# Patient Record
Sex: Female | Born: 1990 | Race: White | Hispanic: No | Marital: Single | Smoking: Never smoker
Health system: Southern US, Community
[De-identification: ages and names within clinical notes are randomized; demographics above are authoritative.]

## PROBLEM LIST (undated history)

## (undated) DIAGNOSIS — H4710 Unspecified papilledema: Secondary | ICD-10-CM

## (undated) HISTORY — PX: LUMBAR PUNCTURE: SHX1985

---

## 2013-04-11 ENCOUNTER — Encounter: Payer: Self-pay | Admitting: Neurology

## 2013-04-12 ENCOUNTER — Ambulatory Visit: Payer: Self-pay | Admitting: Neurology

## 2013-06-14 ENCOUNTER — Other Ambulatory Visit (HOSPITAL_COMMUNITY): Payer: Self-pay | Admitting: *Deleted

## 2013-06-14 DIAGNOSIS — G43909 Migraine, unspecified, not intractable, without status migrainosus: Secondary | ICD-10-CM

## 2013-06-18 ENCOUNTER — Encounter (HOSPITAL_COMMUNITY): Payer: Self-pay | Admitting: Pharmacy Technician

## 2013-06-20 ENCOUNTER — Other Ambulatory Visit: Payer: Self-pay | Admitting: Radiology

## 2013-06-21 ENCOUNTER — Ambulatory Visit (HOSPITAL_COMMUNITY)
Admission: RE | Admit: 2013-06-21 | Discharge: 2013-06-21 | Disposition: A | Discharge status: Home / Self Care | Payer: BC Managed Care – PPO | Source: Ambulatory Visit | Attending: Emergency Medicine | Admitting: Emergency Medicine

## 2013-06-21 DIAGNOSIS — G43909 Migraine, unspecified, not intractable, without status migrainosus: Secondary | ICD-10-CM

## 2013-06-21 DIAGNOSIS — R51 Headache: Secondary | ICD-10-CM | POA: Insufficient documentation

## 2013-06-21 DIAGNOSIS — H4710 Unspecified papilledema: Secondary | ICD-10-CM | POA: Insufficient documentation

## 2013-06-21 LAB — CSF CELL COUNT WITH DIFFERENTIAL
RBC Count, CSF: 49 /mm3 — ABNORMAL HIGH
TUBE #: 1
WBC, CSF: 1 /mm3 (ref 0–5)

## 2013-06-21 LAB — GLUCOSE, CSF: Glucose, CSF: 60 mg/dL (ref 43–76)

## 2013-06-21 LAB — PROTEIN, CSF: TOTAL PROTEIN, CSF: 27 mg/dL (ref 15–45)

## 2013-06-21 MED ORDER — ACETAMINOPHEN 500 MG PO TABS
1000.0000 mg | ORAL_TABLET | Freq: Four times a day (QID) | ORAL | Status: DC | PRN
  Filled 2013-06-21: qty 2

## 2013-06-21 NOTE — Discharge Instructions (Signed)

## 2013-06-22 LAB — FLUORESCENT TREPONEMAL AB(FTA)-IGG-BLD: FLUORESCENT TREPONEMAL AB, IGG: NONREACTIVE

## 2013-06-23 LAB — VDRL, CSF: SYPHILIS VDRL QUANT CSF: NONREACTIVE

## 2013-06-24 ENCOUNTER — Emergency Department (HOSPITAL_COMMUNITY): Payer: BC Managed Care – PPO

## 2013-06-24 ENCOUNTER — Encounter (HOSPITAL_COMMUNITY): Payer: Self-pay | Admitting: Emergency Medicine

## 2013-06-24 ENCOUNTER — Emergency Department (HOSPITAL_COMMUNITY)
Admission: EM | Admit: 2013-06-24 | Discharge: 2013-06-24 | Disposition: A | Discharge status: Home / Self Care | Payer: BC Managed Care – PPO | Attending: Emergency Medicine | Admitting: Emergency Medicine

## 2013-06-24 DIAGNOSIS — G971 Other reaction to spinal and lumbar puncture: Secondary | ICD-10-CM | POA: Insufficient documentation

## 2013-06-24 DIAGNOSIS — Z79899 Other long term (current) drug therapy: Secondary | ICD-10-CM | POA: Insufficient documentation

## 2013-06-24 DIAGNOSIS — H4710 Unspecified papilledema: Secondary | ICD-10-CM | POA: Diagnosis not present

## 2013-06-24 DIAGNOSIS — H53149 Visual discomfort, unspecified: Secondary | ICD-10-CM | POA: Diagnosis not present

## 2013-06-24 DIAGNOSIS — R51 Headache: Secondary | ICD-10-CM | POA: Insufficient documentation

## 2013-06-24 HISTORY — DX: Unspecified papilledema: H47.10

## 2013-06-24 LAB — URINALYSIS, ROUTINE W REFLEX MICROSCOPIC
BILIRUBIN URINE: NEGATIVE
Glucose, UA: NEGATIVE mg/dL
Hgb urine dipstick: NEGATIVE
KETONES UR: NEGATIVE mg/dL
Leukocytes, UA: NEGATIVE
NITRITE: NEGATIVE
PROTEIN: NEGATIVE mg/dL
Specific Gravity, Urine: 1.02 (ref 1.005–1.030)
Urobilinogen, UA: 0.2 mg/dL (ref 0.0–1.0)
pH: 5.5 (ref 5.0–8.0)

## 2013-06-24 LAB — POC URINE PREG, ED: Preg Test, Ur: NEGATIVE

## 2013-06-24 MED ORDER — DEXAMETHASONE SODIUM PHOSPHATE 10 MG/ML IJ SOLN
10.0000 mg | Freq: Once | INTRAMUSCULAR | Status: AC
  Administered 2013-06-24: 10 mg via INTRAVENOUS
  Filled 2013-06-24: qty 1

## 2013-06-24 MED ORDER — CAFFEINE-SODIUM BENZOATE 125-125 MG/ML IJ SOLN: 250.0000 mg | Freq: Once | INTRAMUSCULAR | Status: DC

## 2013-06-24 MED ORDER — SODIUM CHLORIDE 0.9 % IV BOLUS (SEPSIS)
1000.0000 mL | Freq: Once | INTRAVENOUS | Status: AC
  Administered 2013-06-24: 1000 mL via INTRAVENOUS

## 2013-06-24 MED ORDER — METOCLOPRAMIDE HCL 5 MG/ML IJ SOLN
10.0000 mg | Freq: Once | INTRAMUSCULAR | Status: AC
  Administered 2013-06-24: 10 mg via INTRAVENOUS
  Filled 2013-06-24: qty 2

## 2013-06-24 MED ORDER — IOHEXOL 180 MG/ML  SOLN
20.0000 mL | Freq: Once | INTRAMUSCULAR | Status: AC | PRN
  Administered 2013-06-24: 20 mL via INTRATHECAL

## 2013-06-24 MED ORDER — DIPHENHYDRAMINE HCL 50 MG/ML IJ SOLN
25.0000 mg | Freq: Once | INTRAMUSCULAR | Status: AC
  Administered 2013-06-24: 25 mg via INTRAVENOUS
  Filled 2013-06-24: qty 1

## 2013-06-24 NOTE — Discharge Instructions (Signed)
As discussed with our radiologist, it is important that you rest for the next 24 hours, try to maintain horizontal positioning.  Return here for concerning changes in her condition, otherwise please followup with your primary care physician and/or neurologist.   Epidural Blood Patch for Spinal Headache An epidural blood patch is a procedure that is used to treat a headache that occurs when there is a leak of spinal fluid. This type of headache is called a spinal headache or post-dural puncture headache. Spinal headaches sometimes occur after a person undergoes a type of anesthesia called epidural anesthesia or after a lumbar puncture (also called a spinal tap).  Generally, an epidural blood patch is done when a spinal headache has not been relieved by 2 3 days of:   Bed rest.   Drinking lots of fluids.   Taking oral medicines for pain, including nonsteroidal anti-inflammatory agents and caffeine. It may also be done to treat a person who has had epidural anesthesia and is experiencing:   Neck pain and stiffness that are very severe and associated with vomiting.   Hearing loss.   Double vision.  An epidural blood patch is not done when:   Your headache is due to an infection in the lower back (lumbar) area or the blood.   You have bleeding tendencies.   You are taking certain blood-thinning medicines. LET [facility] PROVIDER KNOW ABOUT:  Any allergies you have.   All medicines you are taking, including vitamins, herbs, eye drops, creams, and over-the-counter medicines.   Previous problems you or members of your family have had with the use of anesthetics.   Any blood disorders you have.   Previous surgeries you have had.   Medical conditions you have.  RISKS AND COMPLICATIONS Generally, an epidural blood patch is a safe procedure. However, as with any procedure, complications can occur. Possible complications include:   Backache.  Nerve pain, tingling,  or numbness.  Bleeding.  Infection. Complications are more likely to occur in people who have bleeding disorders or infections. BEFORE THE PROCEDURE  Drink a lot of water the day before your procedure.  Make sure your health care provider knows about all medicines and dietary or herbal supplements that you are taking. Take them as directed and find out if you need to stop any of them prior to the procedure.  Follow your health care provider's instructions for when to stop eating and drinking.  Arrange for someone to drive you to and from the procedure. PROCEDURE   You will have two IV lines placed one to give you fluids and medicines during the procedure and one to withdraw blood for the patch.  You will lie on your stomach.  An X-ray machine will take pictures of your back to locate the area of leakage.  Dye may be injected so that the area can be seen well on an X-ray.  Blood will be drawn from your arm and injected into the leaking area. When the blood is injected, you may feel tightness in your buttocks, lower back, or thighs. AFTER THE PROCEDURE  You will be expected to lie on your back for 2 4 hours with some pillows under your knees. It is important to lie still while on your back so that a good clot can form. You should also avoid any straining, especially right after the procedure.  Most people obtain almost instant relief from the spinal headache. In some, the relief comes on gradually over a 24-hour period.  Some people experience mild backaches for a few days. In a few cases, people also have a mild, passing sensation of prickly or tingly skin (paresthesia), neck pain, or nerve-root pain. Document Released: 10/02/2001 Document Revised: 01/31/2013 Document Reviewed: 11/22/2012 [facility] Patient Information 2014 [place], [place].

## 2013-06-24 NOTE — ED Provider Notes (Signed)
CSN: [id number]     Arrival date & time 06/24/13  0759 History   First MD Initiated Contact with Patient 06/24/13 [phone]     Chief Complaint  Patient presents with  . Headache    Post LP      HPI  Patient presents with headache.  Headache began approximately 3 days ago, soon after the patient had lumbar puncture. Since onset there is bed sore, anterior, throbbing headache.  Pain is not improved with OTC medication. There are no associated visual changes, neck pain, back pain, fever, vomiting. There is associated nausea. Patient had lumbar puncture performed because of papilledema, enlarged optic nerve according to the patient.  These findings were made during routine outpatient evaluation, not because of recent health changes. The patient states that the procedure itself was initially well tolerated, with no complications. She also denies any new unilateral weakness, incontinence, abdominal pain, chest pain, rash, swelling.   Past Medical History  Diagnosis Date  . Papilledema    Past Surgical History  Procedure Laterality Date  . Lumbar puncture     No family history on file. History  Substance Use Topics  . Smoking status: Never Smoker   . Smokeless tobacco: Not on file  . Alcohol Use: Yes     Comment: occasional    OB History   Grav Para Term Preterm Abortions TAB SAB Ect Mult Living                 Review of Systems  Constitutional:       Per HPI, otherwise negative  HENT:       Per HPI, otherwise negative  Eyes: Positive for photophobia.  Respiratory:       Per HPI, otherwise negative  Cardiovascular:       Per HPI, otherwise negative  Gastrointestinal: Negative for vomiting.  Endocrine:       Negative aside from HPI  Genitourinary:       Neg aside from HPI   Musculoskeletal:       Per HPI, otherwise negative  Skin: Negative.   Neurological: Positive for headaches. Negative for syncope.      Allergies  Shrimp  Home Medications   Current  Outpatient Rx  Name  Route  Sig  Dispense  Refill  . Aspirin-Acetaminophen-Caffeine (EXCEDRIN PO)   Oral   Take 2 tablets by mouth daily as needed (for headaches).         . Magnesium 100 MG TABS   Oral   Take 100 mg by mouth daily.         . Topiramate ER (TROKENDI XR) 100 MG CP24   Oral   Take 100 mg by mouth at bedtime.          BP 121/72  Pulse 91  Temp(Src) 98.1 F (36.7 C) (Oral)  Resp 16  Ht 5' 8.75" (1.746 m)  Wt 130 lb (58.968 kg)  BMI 19.34 kg/m2  SpO2 100%  LMP 06/15/2013 Physical Exam  Nursing note and vitals reviewed. Constitutional: She is oriented to person, place, and time. She appears well-developed and well-nourished.  Uncomfortable appearing young F  HENT:  Head: Normocephalic and atraumatic.  Eyes: Conjunctivae and EOM are normal. Pupils are equal, round, and reactive to light. Right eye exhibits no discharge. Left eye exhibits no discharge. No scleral icterus.  Appropriate consensual response No appreciable papilledema on direct visualization  Neck: Normal range of motion. Neck supple. No tracheal deviation present. No thyromegaly present.  Cardiovascular: Normal  rate and regular rhythm.   Pulmonary/Chest: Effort normal and breath sounds normal. No stridor. No respiratory distress.  Abdominal: She exhibits no distension.  Musculoskeletal: She exhibits no edema.       Arms: Lymphadenopathy:    She has no cervical adenopathy.  Neurological: She is alert and oriented to person, place, and time. She displays no atrophy and no tremor. No cranial nerve deficit or sensory deficit. She exhibits normal muscle tone. She displays no seizure activity. Coordination normal.  Skin: Skin is warm and dry.  Psychiatric: She has a normal mood and affect.    ED Course  Procedures (including critical care time) Labs Review Labs Reviewed  URINALYSIS, ROUTINE W REFLEX MICROSCOPIC  POC URINE PREG, ED   Imaging Review I reviewed the notes from IR LP 2/26 - no  complication.  +RBC in tube one, but otherwise unremarkable.    9:35 AM On re-exam - no new complaints.   11:15 AM With minimal improvement in her pain I discussed the patient's case with interventional radiology.  Patient will have lumbar blood patch.  2:39 PM Patient has returned from a interventional radiology.  She is now intolerant of oral intake, pain is improved substantially. MDM   Final diagnoses:  Post lumbar puncture headache    Patient presents for elective lumbar puncture, now with headache.  Patient did not improve here with initial rehydration, pain medication.  Interventional radiology was contacted, and graciously assisted with placement of a blood patch for post lumbar puncture headache.  Patient improved substantially, was appropriate for discharge.  With this improvement, absence of fever, neurologic dysfunction, there is low suspicion for occult infection or other systemic pathology    Gerhard Munchobert Ceyda Peterka, MD 06/24/13 1440

## 2013-06-24 NOTE — ED Notes (Signed)
Pt went to Radiology.

## 2013-06-24 NOTE — ED Notes (Signed)
Pt c/o headache to left side of head along with nausea that started Friday morning. Pt had LP done Thursday because of papilledema. Denies any changes in vision or neck and back pain. Pain increases when standing up.  Pt has tried acetaminophen, ASA and Excedrin with no relief.

## 2013-06-24 NOTE — ED Notes (Signed)
Back from Radiology.

## 2013-06-24 NOTE — Procedures (Signed)
Fluoroscopic guided epidural blood patch on the right at L4/5. Confirmed with 0.5 ml Omni 300 15ml of autologous blood. No complications. Pt. Given routine f/u orders of bedrest for 24 hrs.

## 2013-06-28 NOTE — Progress Notes (Signed)
Pt came in today to have us check her blood patch injection site. Site is slightly swollen, there is no discoloration or redness. Dr. Benard Rinkurnes in to visit and explained some of the blood was under her skin and that it would reabsorb in time. Pt told to use heat and ice to area and take ibuprofen as needed for discomfort.

## 2013-07-03 LAB — B. BURGDORFI ANTIBODIES, CSF: Lyme Ab: NEGATIVE

## 2015-03-21 IMAGING — RF DG FLUORO GUIDE LUMBAR PUNCTURE
1 series · 1 of 1 positions shown · non-contrast
Comparison: none

CLINICAL DATA: Headaches.  Papilledema.

[Series 1: run · 1 of 1 slices shown]
[im 1/1]
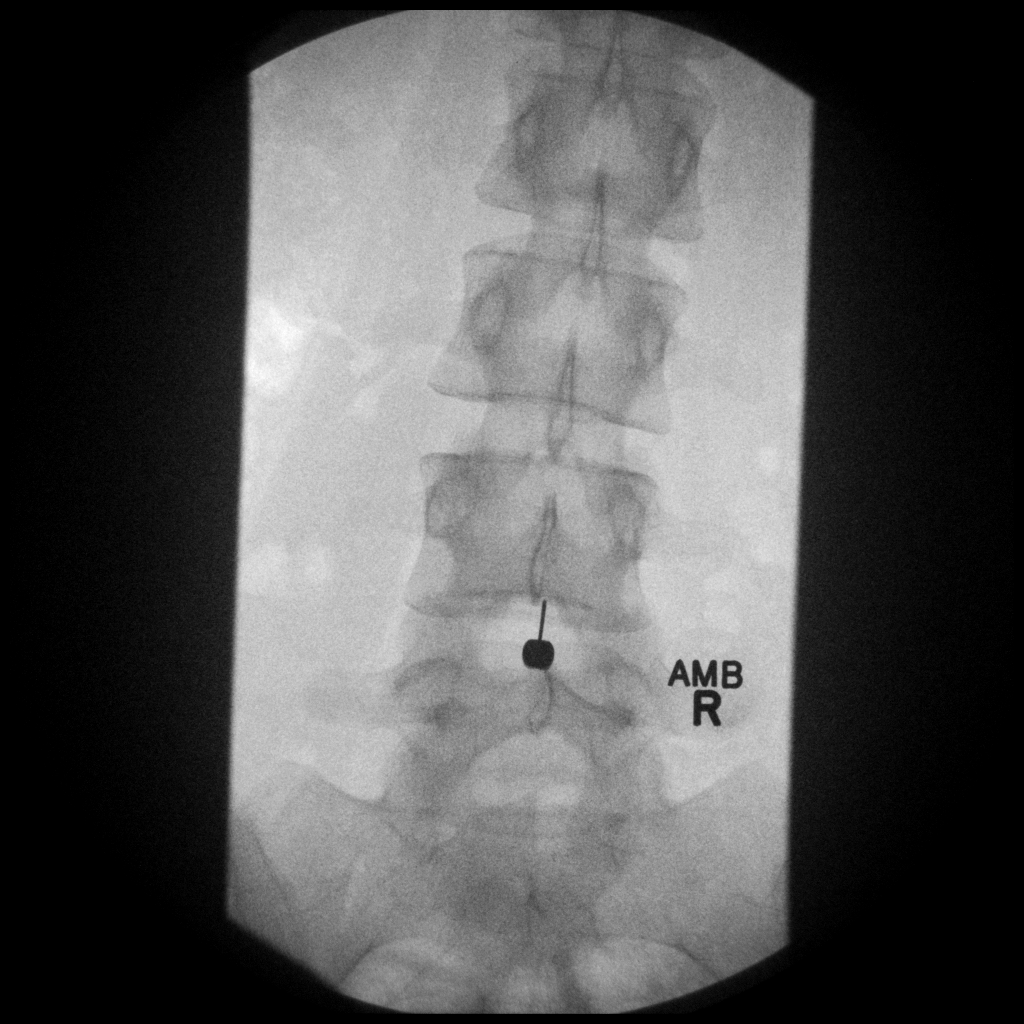

[1 of 1 positions shown; findings below may reference images not displayed]

EXAM:
DIAGNOSTIC LUMBAR PUNCTURE UNDER FLUOROSCOPIC GUIDANCE

FLUOROSCOPY TIME:  0 min 9 seconds

PROCEDURE:
Informed consent was obtained from the patient prior to the
procedure, including potential complications of headache, allergy,
and pain. With the patient prone, the lower back was prepped with
Betadine. 1% Lidocaine was used for local anesthesia. Lumbar
puncture was performed at the L4-5 level using a 21 gauge needle
with return of clear colorless CSF with an opening pressure of 24 cm
water. 23ml of CSF were obtained for laboratory studies. The patient
tolerated the procedure well and there were no apparent
complications.
IMPRESSION: Lumbar puncture performed without complication at L4-5.

## 2015-03-24 IMAGING — RF DG FLUORO GUIDE LUMBAR PUNCTURE
1 series · 1 of 1 positions shown · non-contrast
Comparison: none

CLINICAL DATA: Positional headaches following lumbar puncture. The
headaches have been resilient to other forms of treatment.

[Series 1: run · 1 of 1 slices shown]
[im 1/1]
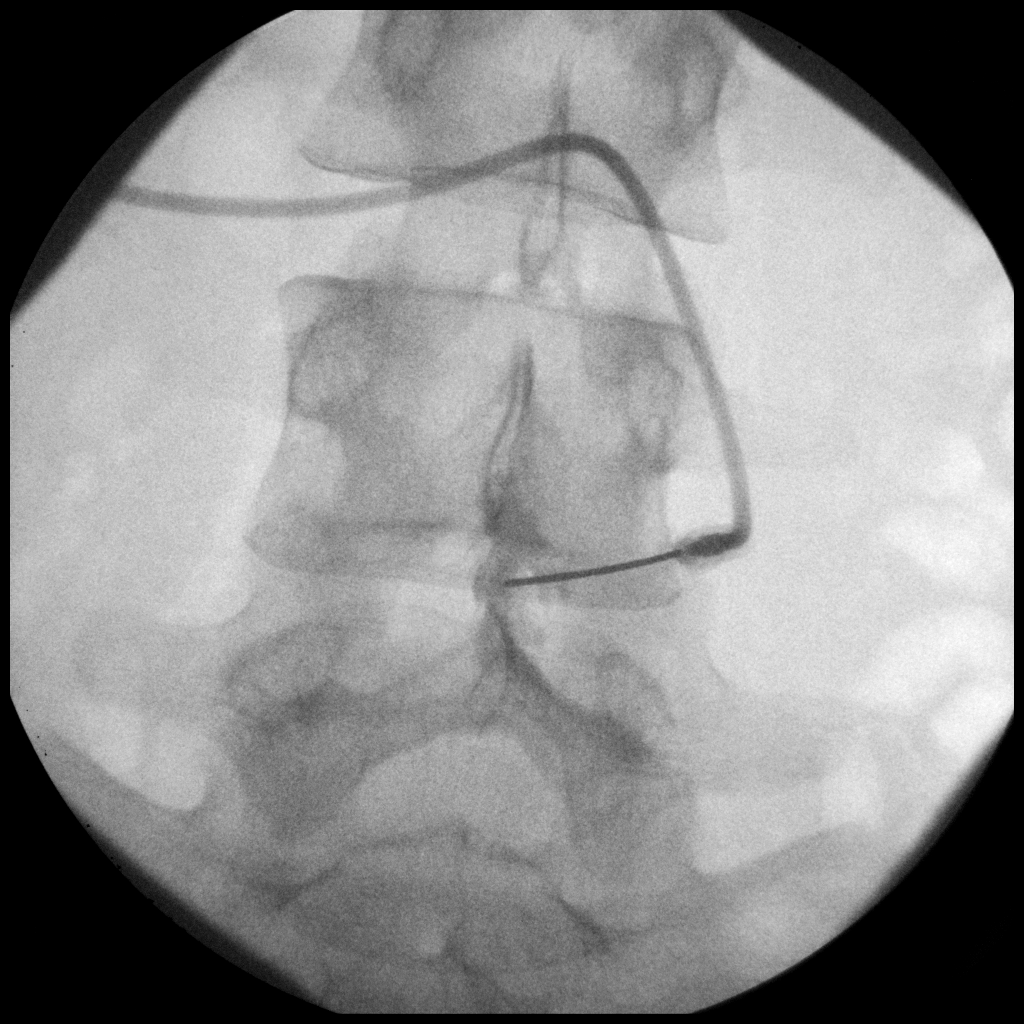

[1 of 1 positions shown; findings below may reference images not displayed]

EXAM:
LUMBAR PUNCTURE FLUORO GUIDE

PROCEDURE:
The procedure, risks, benefits, and alternatives were explained to
the patient. Questions regarding the procedure were encouraged and
answered. The patient understands and consents to the procedure.

LUMBAR EPIDURAL BLOOD PATCH: 15 ml of blood were withdrawn from the
patient's antecubital fossa. An epidural approach was taken on the
right at L4-5 using a 20 gauge Crawford epidural needle. Epidural
positioning was confirmed by injecting a small amount of Omnipaque
180. There was no vascular communication. 15 ml of the patient's
blood was slowly injected into the epidural space in this location.
The procedure was well-tolerated and she was discharged in good
condition with instructions to lie down for additional day.
IMPRESSION: Lumbar epidural blood patch on the right Ardon4-5.

## 2016-02-11 ENCOUNTER — Encounter: Payer: Self-pay | Admitting: Podiatry

## 2016-02-11 ENCOUNTER — Ambulatory Visit (INDEPENDENT_AMBULATORY_CARE_PROVIDER_SITE_OTHER): Payer: 59 | Admitting: Podiatry

## 2016-02-11 VITALS — BP 122/80 | HR 88 | Resp 16 | Ht 68.0 in | Wt 125.0 lb

## 2016-02-11 DIAGNOSIS — B351 Tinea unguium: Secondary | ICD-10-CM

## 2016-02-11 DIAGNOSIS — L600 Ingrowing nail: Secondary | ICD-10-CM | POA: Diagnosis not present

## 2016-02-11 MED ORDER — TERBINAFINE HCL 250 MG PO TABS: 250.0000 mg | ORAL_TABLET | Freq: Every day | ORAL | 0 refills | Status: AC

## 2016-02-11 NOTE — Patient Instructions (Signed)

## 2016-02-11 NOTE — Progress Notes (Signed)
   Subjective:    Patient ID: Courtney Mcmahon, female    DOB: 03/14/1991, 25 y.o.   MRN: [id number]  HPI Chief Complaint  Patient presents with  . Nail Problem    Bilateral; great toes; nail discoloration & thickened nails; pt stated, "stubbed toes several years ago and nails have not been the same since"      Review of Systems  All other systems reviewed and are negative.      Objective:   Physical Exam        Assessment & Plan:

## 2016-02-11 NOTE — Progress Notes (Signed)
Subjective:     Patient ID: Courtney Mcmahon, femaleMagda Kiel   DOB: 07/01/1990, 25 y.o.   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      Objective:   Physical Exam  Constitutional: She is oriented to person, place, and time.  Cardiovascular: Intact distal pulses.   Musculoskeletal: Normal range of motion.  Neurological: She is oriented to person, place, and time.  Skin: Skin is warm.  Nursing note and vitals reviewed.  neurovascular status intact muscle strength adequate range of motion within normal limits with patient found to have damaged hallux nails of both feet that are dystrophic and lifting. Patient has a family history of this with sister having 1 but does not remember specific trauma. 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 I reviewed the possibility for permanent nail removal versus temporary and also oral antifungal therapy. Patient wants to try this but I absolutely explaining there is no guarantee that this will grow out normally and that long-term it'll either be the same or possibly worse and that ultimately she may require nail removal. I infiltrated each hallux 60 mg I can Marcaine mixture remove the hallux nails created cleaning beds will allow a Azucena Kubaeid growth to occur and placed on Lamisil 250 mg daily for 90 days and also gave him a sheet for blood work to be done to include liver function. She is to reappoint in 4-5 months or earlier if any issues should occur

## 2016-06-16 ENCOUNTER — Other Ambulatory Visit: Payer: Self-pay | Admitting: Physician Assistant

## 2016-06-16 DIAGNOSIS — L989 Disorder of the skin and subcutaneous tissue, unspecified: Secondary | ICD-10-CM

## 2016-06-17 ENCOUNTER — Ambulatory Visit
Admission: RE | Admit: 2016-06-17 | Discharge: 2016-06-17 | Disposition: A | Discharge status: Home / Self Care | Payer: 59 | Source: Ambulatory Visit | Attending: Physician Assistant | Admitting: Physician Assistant

## 2016-06-17 DIAGNOSIS — L989 Disorder of the skin and subcutaneous tissue, unspecified: Secondary | ICD-10-CM

## 2016-07-14 ENCOUNTER — Ambulatory Visit: Payer: 59 | Admitting: Podiatry

## 2017-02-27 IMAGING — US US SOFT TISSUE HEAD/NECK
1 series · 14 of 16 positions shown · non-contrast
Comparison: None.

CLINICAL DATA: Bilateral posterior auricular lesions noted 2 days
ago

EXAM:
ULTRASOUND OF HEAD/NECK SOFT TISSUES
TECHNIQUE: Ultrasound examination of the head and neck soft tissues was
performed in the area of clinical concern.

[Series 1: us soft tissue head/neck · 0.05mm/px · 14 of 16 slices shown]
[im 1/16]
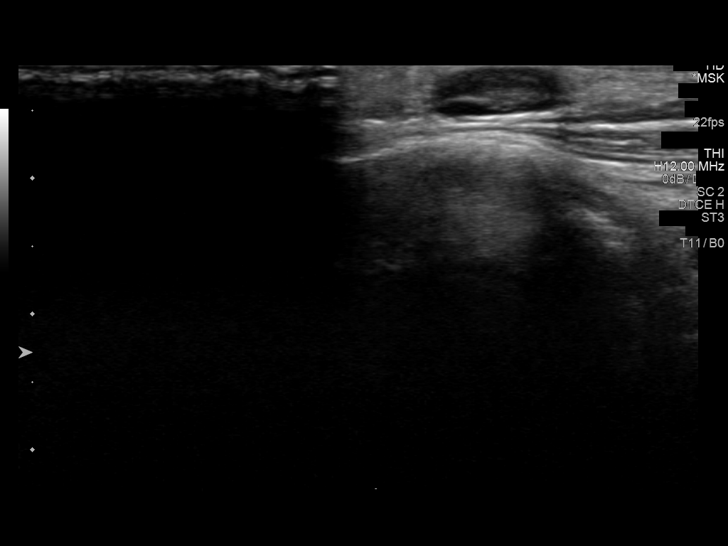
[im 2/16]
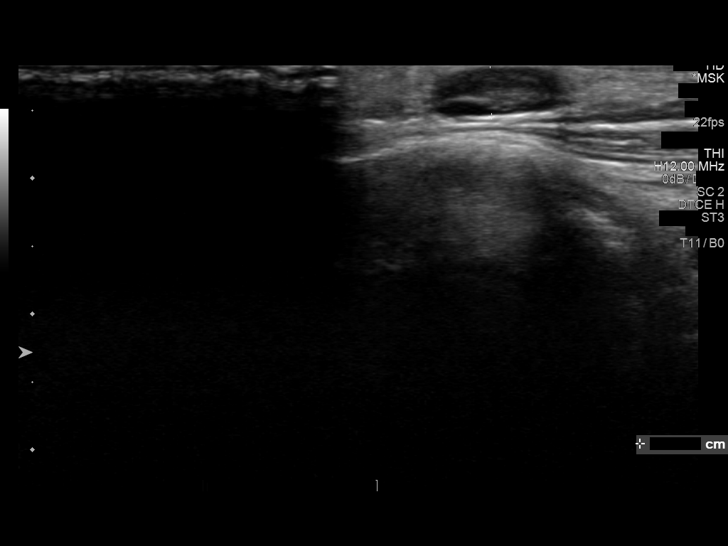
[im 3/16]
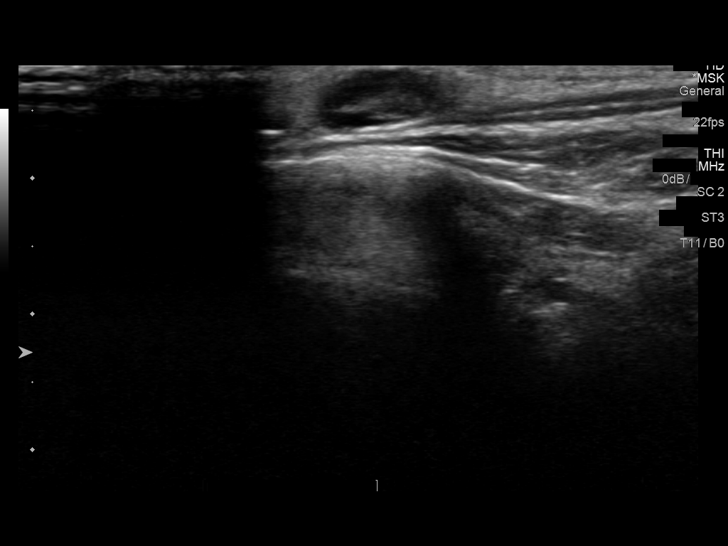
[im 5/16]
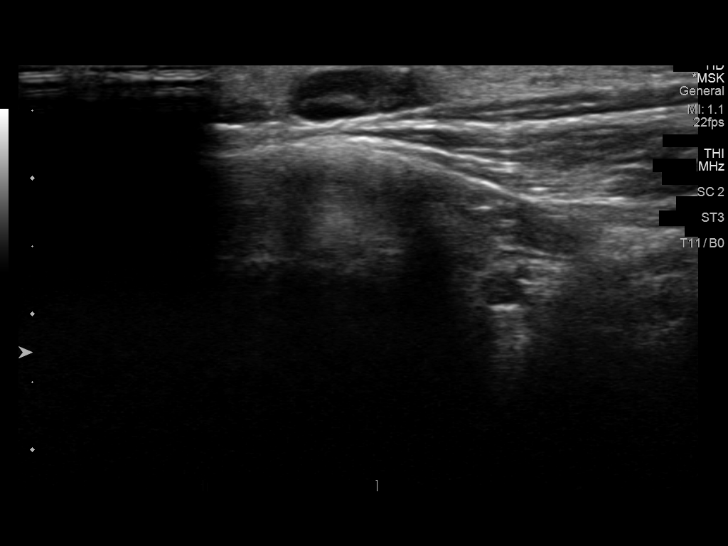
[im 6/16]
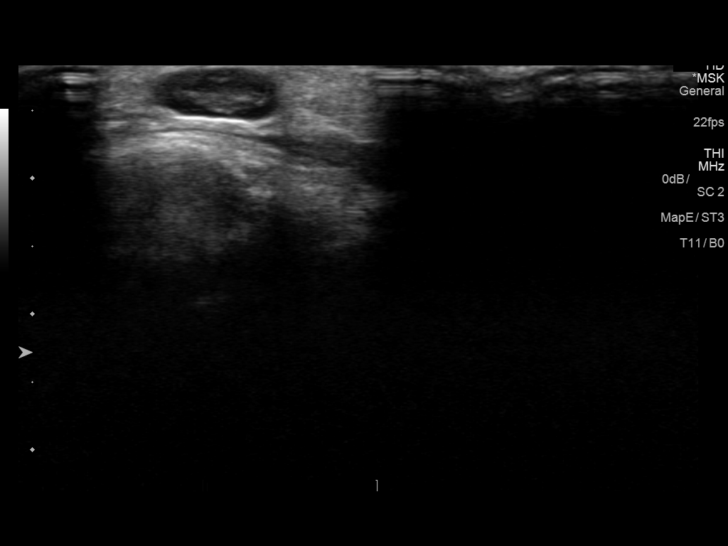
[im 7/16]
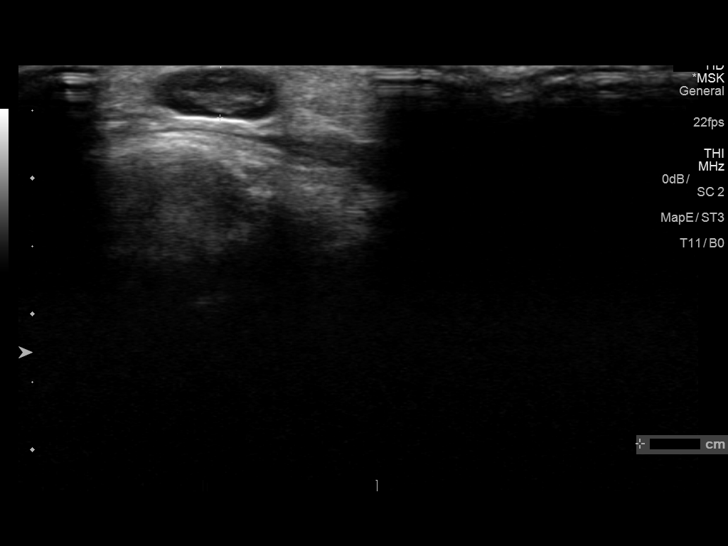
[im 8/16]
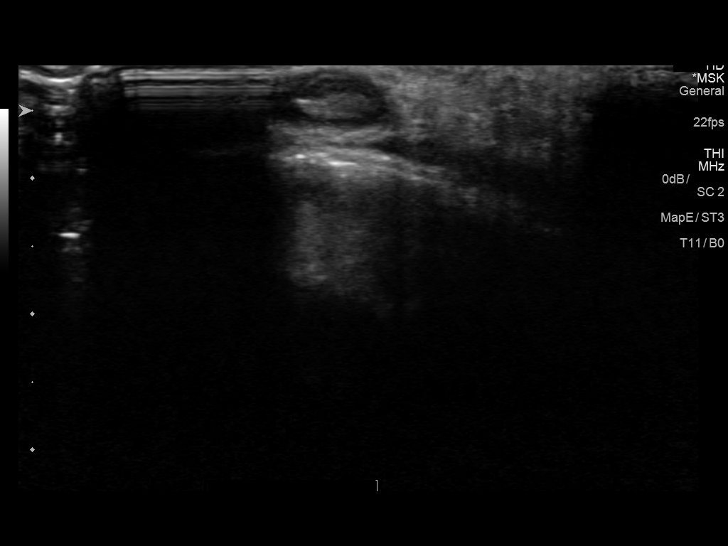
[im 9/16]
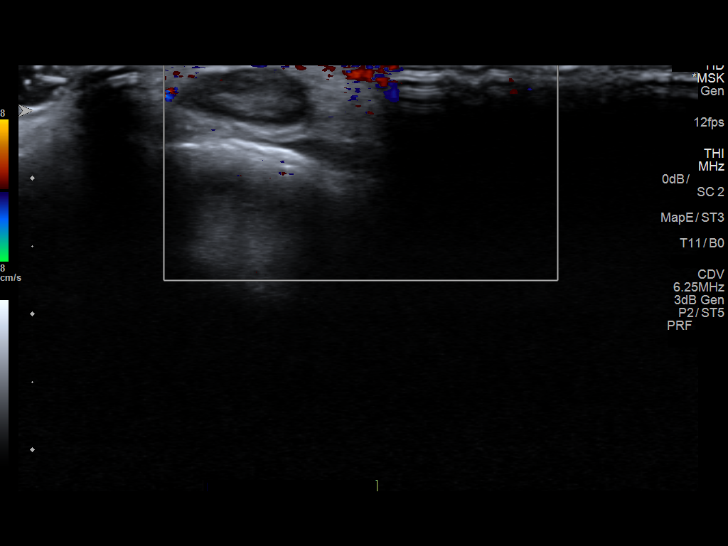
[im 10/16]
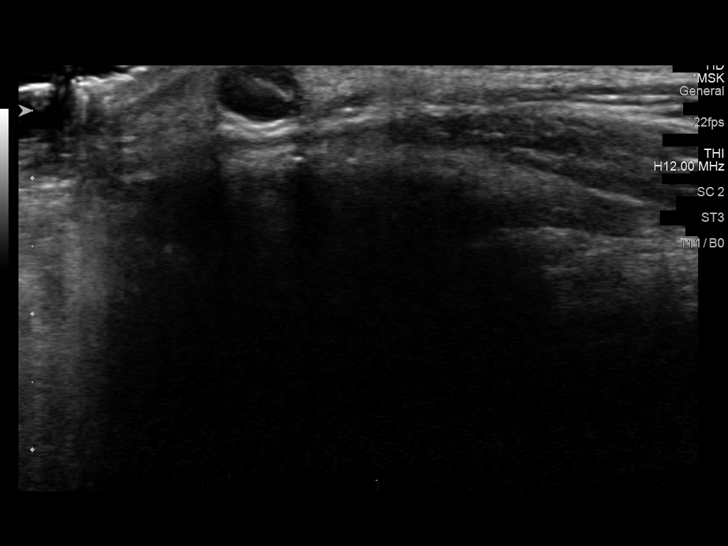
[im 11/16]
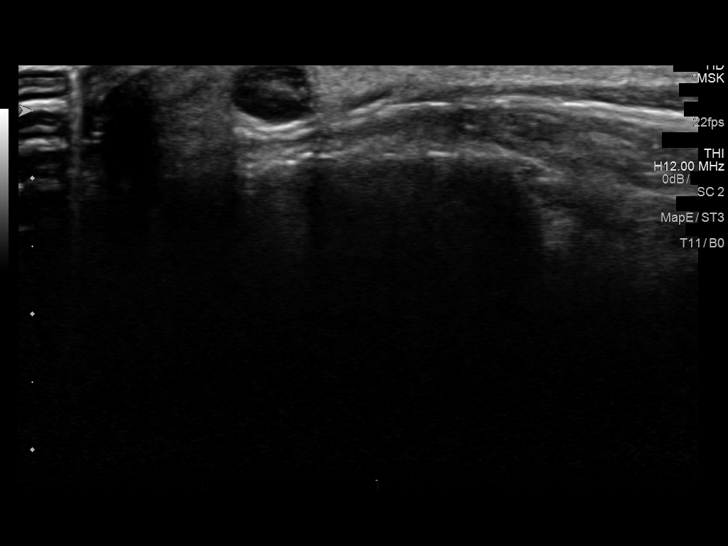
[im 13/16]
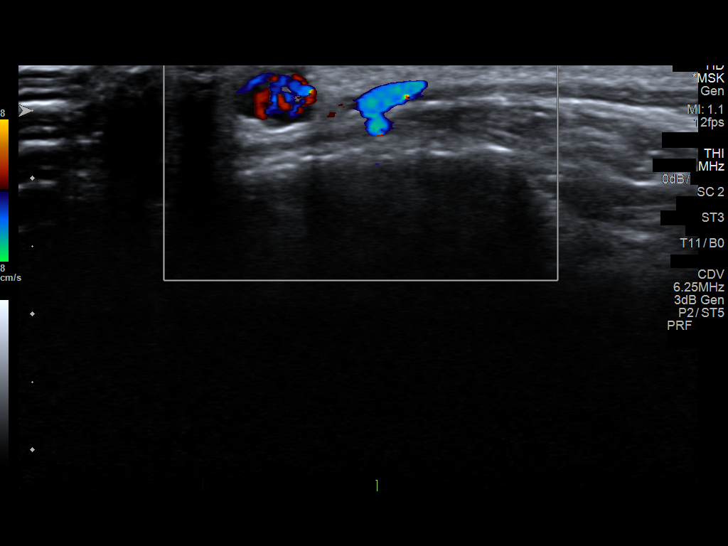
[im 14/16]
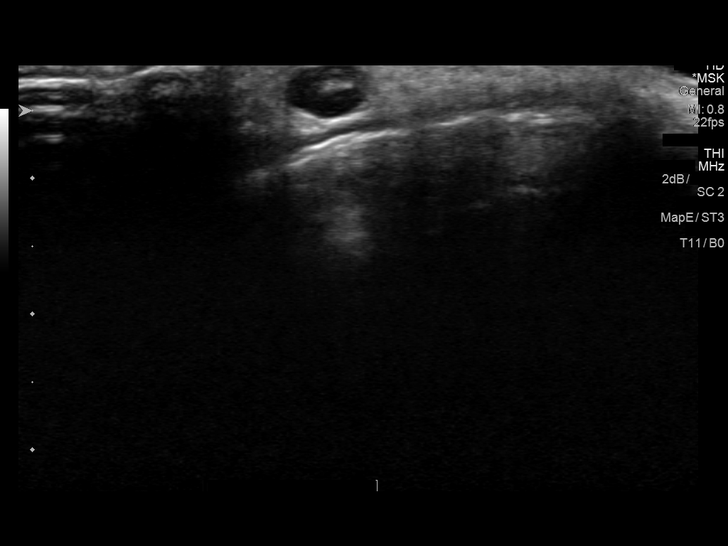
[im 15/16]
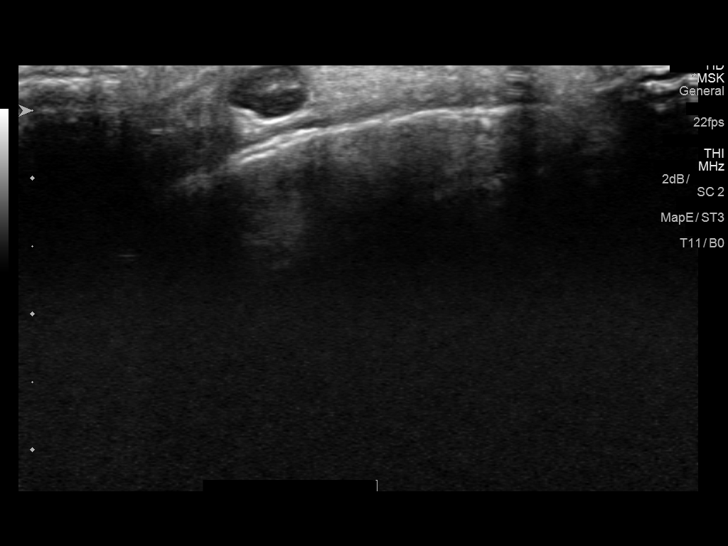
[im 16/16]
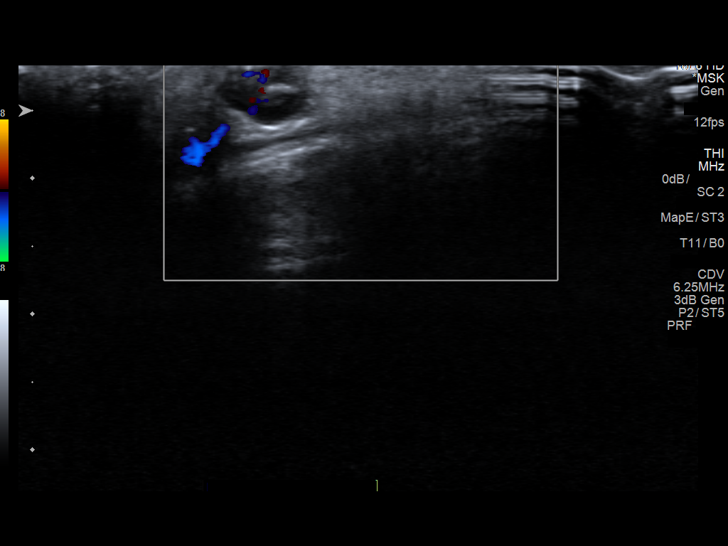

[14 of 16 positions shown; findings below may reference images not displayed]

FINDINGS: Small subcutaneous lymph nodes bilaterally, measuring less than 4 mm
short axis diameter right, less than 5 mm left, most consistent with
benign lymph nodes. No mass, cyst, aneurysm, adenopathy, or abscess
identified.
IMPRESSION: 1. Small bilateral unremarkable cervical lymph nodes. No pathologic
findings.

## 2017-04-04 ENCOUNTER — Encounter (HOSPITAL_COMMUNITY): Payer: Self-pay

## 2017-04-04 ENCOUNTER — Other Ambulatory Visit: Payer: Self-pay

## 2017-04-04 ENCOUNTER — Emergency Department (HOSPITAL_COMMUNITY)
Admission: EM | Admit: 2017-04-04 | Discharge: 2017-04-04 | Disposition: A | Discharge status: Home / Self Care | Payer: 59 | Attending: Emergency Medicine | Admitting: Emergency Medicine

## 2017-04-04 DIAGNOSIS — J069 Acute upper respiratory infection, unspecified: Secondary | ICD-10-CM

## 2017-04-04 DIAGNOSIS — J040 Acute laryngitis: Secondary | ICD-10-CM

## 2017-04-04 DIAGNOSIS — H6693 Otitis media, unspecified, bilateral: Secondary | ICD-10-CM

## 2017-04-04 MED ORDER — AMOXICILLIN 500 MG PO CAPS: 500.0000 mg | ORAL_CAPSULE | Freq: Two times a day (BID) | ORAL | 0 refills | Status: AC

## 2017-04-04 NOTE — ED Notes (Signed)
Patient verbalizes understanding of discharge instructions. Opportunity for questioning and answers were provided. 

## 2017-04-04 NOTE — ED Provider Notes (Signed)
[facility] Provider Note   CSN: [id number] Arrival date & time: 04/04/17  [id number]     History   Chief Complaint Chief Complaint  Patient presents with  . Otalgia    HPI Courtney Kielndrea Marrin is a 26 y.o. female who presents for evaluation of 3 days of bilateral ear pain.  Patient reports that symptoms initially began approximately 1 week ago when she had nasal congestion, nonproductive cough, sore throat.  Patient reports that the sore throat has improved but states that she has not experience bilateral ear pain, right greater than left.  Patient reports that she has been taking Tylenol with minimal improvement.  Patient reports that the other day, she put garlic oil in the ears to help with symptoms.  Patient has not tried any other therapies.  Patient also reports that cough has lingered.  Patient also reports secondary hoarseness.  Patient denies any fever, chest pain, difficulty breathing.  Patient does not have a history of asthma or COPD.  Patient is not a current smoker.   The history is provided by the patient.    Past Medical History:  Diagnosis Date  . Papilledema     There are no active problems to display for this patient.   Past Surgical History:  Procedure Laterality Date  . LUMBAR PUNCTURE      OB History    No data available       Home Medications    Prior to Admission medications   Medication Sig Start Date End Date Taking? Authorizing Provider  amoxicillin (AMOXIL) 500 MG capsule Take 1 capsule (500 mg total) by mouth 2 (two) times daily. 04/04/17   Maxwell CaulLayden, Lindsey A, PA-C  Aspirin-Acetaminophen-Caffeine (EXCEDRIN PO) Take 2 tablets by mouth daily as needed (for headaches).    [provider]  Magnesium 100 MG TABS Take 100 mg by mouth daily.    [provider]  NUVARING 0.12-0.015 MG/24HR vaginal ring INSERT 1 VAGINAL RING EVERY 3 WEEKS 01/28/16   [provider]  terbinafine (LAMISIL) 250 MG  tablet Take 1 tablet (250 mg total) by mouth daily. 02/11/16   Lenn Sinkegal, Norman S, DPM    Family History No family history on file.  Social History Social History   Tobacco Use  . Smoking status: Never Smoker  . Smokeless tobacco: Never Used  Substance Use Topics  . Alcohol use: Yes    Comment: occasional   . Drug use: No     Allergies   Shrimp [shellfish allergy]   Review of Systems Review of Systems  Constitutional: Negative for fever.  HENT: Positive for congestion, ear pain, rhinorrhea, sore throat and voice change.   Respiratory: Positive for cough. Negative for shortness of breath.   Cardiovascular: Negative for chest pain.     Physical Exam Updated Vital Signs BP 139/85 (BP Location: Right Arm)   Pulse 89   Temp 98.3 F (36.8 C) (Oral)   Resp 16   Ht 5\' 8"  (1.727 m)   Wt 59 kg (130 lb)   LMP 03/28/2017   SpO2 100%   BMI 19.77 kg/m   Physical Exam  Constitutional: She appears well-developed and well-nourished.  HENT:  Head: Normocephalic and atraumatic.  Right Ear: Tympanic membrane is erythematous and bulging.  Left Ear: Tympanic membrane is erythematous. A middle ear effusion is present.  Mouth/Throat: Posterior oropharyngeal erythema present. No oropharyngeal exudate, posterior oropharyngeal edema or tonsillar abscesses.  Posterior oropharynx is erythematous but no evidence of exudates  or tonsillar edema.  No evidence of peritonsillar abscess.  No trismus.  No facial or neck swelling  Eyes: Conjunctivae and EOM are normal. Right eye exhibits no discharge. Left eye exhibits no discharge. No scleral icterus.  Neck:  No neck mass or swelling  Pulmonary/Chest: Effort normal.  Neurological: She is alert.  Skin: Skin is warm and dry.  Psychiatric: She has a normal mood and affect. Her speech is normal and behavior is normal.  Nursing note and vitals reviewed.    ED Treatments / Results  Labs (all labs ordered are listed, but only abnormal results are  displayed) Labs Reviewed - No data to display  EKG  EKG Interpretation None       Radiology No results found.  Procedures Procedures (including critical care time)  Medications Ordered in ED Medications - No data to display   Initial Impression / Assessment and Plan / ED Course  I have reviewed the triage vital signs and the nursing notes.  Pertinent labs & imaging results that were available during my care of the patient were reviewed by me and considered in my medical decision making (see chart for details).     [age] year old female who presents for evaluation of 3 days of bilateral ear pain, right greater than left.  Patient reports a prior to onset of symptoms, she has had nasal congestion, rhinorrhea, sore throat.  Patient denies any fevers, chest pain, shortness of breath. Patient is afebrile, non-toxic appearing, sitting comfortably on examination table. Vital signs reviewed and stable.  Physical exam shows evidence of bilateral otitis media.  Patient has posterior oropharynx erythema but no exudate, edema.  No evidence of peritonsillar abscess or Ludwig angina.  Patient with no known drug allergies.  Will plan to treat for otitis media.  Conservative therapies discussed with patient. Provided patient with a list of clinic resources to use if he does not have a PCP. Instructed to call them today to arrange follow-up in the next 24-48 hours. Strict return precautions discussed. Patient expresses understanding and agreement to plan.    Final Clinical Impressions(s) / ED Diagnoses   Final diagnoses:  Bilateral otitis media, unspecified otitis media type  Laryngitis  Upper respiratory tract infection, unspecified type    ED Discharge Orders        Ordered    amoxicillin (AMOXIL) 500 MG capsule  2 times daily     04/04/17 1021       Rosana HoesLayden, Lindsey A, PA-C 04/04/17 1708    Maia PlanLong, Joshua G, MD 04/05/17 [phone]

## 2017-04-04 NOTE — ED Triage Notes (Signed)
Per Pt, Pt is coming from home with complaints of bilateral ear pain and hoarseness that started last Tuesday. Reports one day of sore throat that went away. Denies fevers at this time.

## 2017-04-04 NOTE — Discharge Instructions (Signed)
You can take Tylenol or Ibuprofen as directed for pain. You can alternate Tylenol and Ibuprofen every 4 hours. If you take Tylenol at 1pm, then you can take Ibuprofen at 5pm. Then you can take Tylenol again at 9pm.   Take antibiotics as directed. Please take all of your antibiotics until finished.  Do not put any more garlic oil in your ears.  Follow-up with a primary care doctor in the next 24-48 hours for further evaluation.  Care doctor, one has been provided in the paperwork.  Return to the emergency department for any fever, difficulty breathing, chest pain, difficulty swallowing, vomiting or any other worsening or concerning symptoms.
